# Patient Record
Sex: Male | Born: 1970 | Race: White | Hispanic: No | Marital: Married | Smoking: Never smoker
Health system: Southern US, Community
[De-identification: ages and names within clinical notes are randomized; demographics above are authoritative.]

## PROBLEM LIST (undated history)

## (undated) DIAGNOSIS — G4730 Sleep apnea, unspecified: Secondary | ICD-10-CM

## (undated) DIAGNOSIS — K219 Gastro-esophageal reflux disease without esophagitis: Secondary | ICD-10-CM

## (undated) DIAGNOSIS — M545 Low back pain, unspecified: Secondary | ICD-10-CM

## (undated) DIAGNOSIS — E785 Hyperlipidemia, unspecified: Secondary | ICD-10-CM

## (undated) HISTORY — DX: Sleep apnea, unspecified: G47.30

## (undated) HISTORY — DX: Gastro-esophageal reflux disease without esophagitis: K21.9

## (undated) HISTORY — DX: Hyperlipidemia, unspecified: E78.5

## (undated) HISTORY — DX: Low back pain, unspecified: M54.50

---
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
## 2003-01-02 ENCOUNTER — Encounter: Payer: Self-pay | Admitting: Family Medicine

## 2003-01-02 ENCOUNTER — Encounter: Admission: RE | Admit: 2003-01-02 | Discharge: 2003-01-02 | Payer: Self-pay | Admitting: Family Medicine

## 2006-08-05 ENCOUNTER — Ambulatory Visit: Payer: Self-pay | Admitting: Internal Medicine

## 2006-09-21 ENCOUNTER — Ambulatory Visit: Payer: Self-pay | Admitting: Family Medicine

## 2007-03-17 ENCOUNTER — Encounter (INDEPENDENT_AMBULATORY_CARE_PROVIDER_SITE_OTHER): Payer: Self-pay | Admitting: Family Medicine

## 2007-03-17 ENCOUNTER — Ambulatory Visit: Payer: Self-pay | Admitting: Family Medicine

## 2007-03-17 ENCOUNTER — Encounter: Payer: Self-pay | Admitting: Family Medicine

## 2007-03-17 DIAGNOSIS — M79609 Pain in unspecified limb: Secondary | ICD-10-CM

## 2007-03-17 DIAGNOSIS — J309 Allergic rhinitis, unspecified: Secondary | ICD-10-CM | POA: Insufficient documentation

## 2007-03-18 ENCOUNTER — Ambulatory Visit: Payer: Self-pay

## 2007-12-29 ENCOUNTER — Telehealth (INDEPENDENT_AMBULATORY_CARE_PROVIDER_SITE_OTHER): Payer: Self-pay | Admitting: *Deleted

## 2008-01-20 ENCOUNTER — Ambulatory Visit: Payer: Self-pay | Admitting: Family Medicine

## 2008-01-20 DIAGNOSIS — H6980 Other specified disorders of Eustachian tube, unspecified ear: Secondary | ICD-10-CM

## 2008-03-22 ENCOUNTER — Encounter: Payer: Self-pay | Admitting: Internal Medicine

## 2013-01-09 ENCOUNTER — Encounter (HOSPITAL_BASED_OUTPATIENT_CLINIC_OR_DEPARTMENT_OTHER): Payer: Self-pay | Admitting: *Deleted

## 2013-01-09 ENCOUNTER — Emergency Department (HOSPITAL_BASED_OUTPATIENT_CLINIC_OR_DEPARTMENT_OTHER): Payer: Managed Care, Other (non HMO)

## 2013-01-09 ENCOUNTER — Emergency Department (HOSPITAL_BASED_OUTPATIENT_CLINIC_OR_DEPARTMENT_OTHER)
Admission: EM | Admit: 2013-01-09 | Discharge: 2013-01-09 | Disposition: A | Discharge status: Home / Self Care | Payer: Managed Care, Other (non HMO) | Attending: Emergency Medicine | Admitting: Emergency Medicine

## 2013-01-09 DIAGNOSIS — S40021A Contusion of right upper arm, initial encounter: Secondary | ICD-10-CM

## 2013-01-09 DIAGNOSIS — S8010XA Contusion of unspecified lower leg, initial encounter: Secondary | ICD-10-CM | POA: Insufficient documentation

## 2013-01-09 DIAGNOSIS — S8012XA Contusion of left lower leg, initial encounter: Secondary | ICD-10-CM

## 2013-01-09 DIAGNOSIS — Y929 Unspecified place or not applicable: Secondary | ICD-10-CM | POA: Insufficient documentation

## 2013-01-09 DIAGNOSIS — Y939 Activity, unspecified: Secondary | ICD-10-CM | POA: Insufficient documentation

## 2013-01-09 DIAGNOSIS — W11XXXA Fall on and from ladder, initial encounter: Secondary | ICD-10-CM | POA: Insufficient documentation

## 2013-01-09 DIAGNOSIS — IMO0002 Reserved for concepts with insufficient information to code with codable children: Secondary | ICD-10-CM | POA: Insufficient documentation

## 2013-01-09 DIAGNOSIS — S5010XA Contusion of unspecified forearm, initial encounter: Secondary | ICD-10-CM | POA: Insufficient documentation

## 2013-01-09 MED ORDER — OXYCODONE-ACETAMINOPHEN 5-325 MG PO TABS
2.0000 | ORAL_TABLET | Freq: Once | ORAL | Status: AC
  Administered 2013-01-09: 2 via ORAL
  Filled 2013-01-09 (×2): qty 2

## 2013-01-09 MED ORDER — OXYCODONE-ACETAMINOPHEN 5-325 MG PO TABS: 2.0000 | ORAL_TABLET | ORAL | Status: DC | PRN

## 2013-01-09 NOTE — ED Provider Notes (Signed)
History     CSN: [id number]  Arrival date & time 01/09/13  1336   First MD Initiated Contact with Patient 01/09/13 1404      Chief Complaint  Patient presents with  . Fall    (Consider location/radiation/quality/duration/timing/severity/associated sxs/prior treatment) Patient is a 42 y.o. male presenting with fall. The history is provided by the patient. No language interpreter was used.  Fall The accident occurred less than 1 hour ago. The fall occurred from a ladder. He fell from a height of 3 to 5 ft. The volume of blood lost was minimal. Point of impact: upper left thigh/ right forearm. The pain is at a severity of 7/10. The pain is moderate. He was not ambulatory at the scene. He has tried nothing for the symptoms. The treatment provided moderate relief.  Pt complains of pain in his right upper thigh and left forearm   History reviewed. No pertinent past medical history.  History reviewed. No pertinent past surgical history.  History reviewed. No pertinent family history.  History  Substance Use Topics  . Smoking status: Never Smoker   . Smokeless tobacco: Not on file  . Alcohol Use: Yes      Review of Systems  Skin: Positive for wound.  All other systems reviewed and are negative.    Allergies  Review of patient's allergies indicates no known allergies.  Home Medications  No current outpatient prescriptions on file.  BP 119/65  Pulse 57  Temp 98.5 F (36.9 C) (Oral)  Resp 16  Ht 5\' 11"  (1.803 m)  Wt 170 lb (77.111 kg)  BMI 23.71 kg/m2  SpO2 100%  Physical Exam  Nursing note and vitals reviewed. Constitutional: He appears well-developed and well-nourished.  HENT:  Head: Normocephalic and atraumatic.  Eyes: Conjunctivae normal and EOM are normal. Pupils are equal, round, and reactive to light.  Neck: Normal range of motion.  Cardiovascular: Normal rate.   Pulmonary/Chest: Effort normal.  Abdominal: Soft.  Musculoskeletal:       Tender swollen  right forearm  Abrasion left upper thigh, bruised,  Abrasion left lower leg.     c spine, tspine and ls spine are normal  Neurological: He is alert.  Skin: Skin is warm.    ED Course  Procedures (including critical care time)  Labs Reviewed - No data to display Dg Forearm Right  01/09/2013  *RADIOLOGY REPORT*  Clinical Data: Fall, pain  RIGHT FOREARM - 2 VIEW  Comparison: None.  Findings: Normal alignment without fracture.  No soft tissue abnormality or foreign body.  Radius and ulna appear intact.  IMPRESSION: No acute osseous finding   Original Report Authenticated By: Judie Petit. Shick, M.D.    Dg Femur Left  01/09/2013  *RADIOLOGY REPORT*  Clinical Data: Fall, leg pain, bruising  LEFT FEMUR - 2 VIEW  Comparison: 01/09/2013  Findings: Left femur appears intact.  Negative for fracture or malalignment.  No soft tissue abnormality.  Peripheral vascular calcifications evident.  IMPRESSION: No acute osseous finding   Original Report Authenticated By: Judie Petit. Miles Costain, M.D.    Dg Tibia/fibula Left  01/09/2013  *RADIOLOGY REPORT*  Clinical Data: Fall, leg pain  LEFT TIBIA AND FIBULA - 2 VIEW  Comparison: None.  Findings: Left tibia and fibula appear intact with normal alignment.  No fracture or soft tissue abnormality.  No radiopaque foreign body.  IMPRESSION: No acute finding   Original Report Authenticated By: Judie Petit. Shick, M.D.      1. Contusion of left leg   2.  Contusion of right arm       MDM  Pt reports relief with pain medication,   Bandages to abrasions,   Pt given crutches.   I advised pt to see Dr. Pearletha Forge for recheck this week.   Ice to area of swelling and bruising        Lonia Skinner Cataract, [place] 01/09/13 2306

## 2013-01-09 NOTE — ED Notes (Addendum)
Pt states he fell 6 ft from a ladder and ? Landed on his feet but somehow on the way down his left leg got caught in rungs. How has bruising and abrasion to left thigh, lower leg right arm. Moves all but states left thigh feels numb. Denies neck pain.

## 2013-01-10 NOTE — ED Provider Notes (Signed)
Medical screening examination/treatment/procedure(s) were performed by non-physician practitioner and as supervising physician I was immediately available for consultation/collaboration.  Ethelda Chick, MD 01/10/13 [phone]

## 2019-10-14 ENCOUNTER — Other Ambulatory Visit (HOSPITAL_BASED_OUTPATIENT_CLINIC_OR_DEPARTMENT_OTHER): Payer: Self-pay

## 2019-10-14 DIAGNOSIS — R0683 Snoring: Secondary | ICD-10-CM

## 2019-10-14 DIAGNOSIS — G4730 Sleep apnea, unspecified: Secondary | ICD-10-CM

## 2019-12-07 ENCOUNTER — Ambulatory Visit (HOSPITAL_BASED_OUTPATIENT_CLINIC_OR_DEPARTMENT_OTHER): Payer: Managed Care, Other (non HMO) | Attending: Family Medicine | Admitting: Internal Medicine

## 2019-12-07 ENCOUNTER — Other Ambulatory Visit: Payer: Self-pay

## 2019-12-07 DIAGNOSIS — R0683 Snoring: Secondary | ICD-10-CM | POA: Diagnosis not present

## 2019-12-07 DIAGNOSIS — G4730 Sleep apnea, unspecified: Secondary | ICD-10-CM

## 2019-12-07 DIAGNOSIS — G4733 Obstructive sleep apnea (adult) (pediatric): Secondary | ICD-10-CM | POA: Diagnosis not present

## 2019-12-17 DIAGNOSIS — R0683 Snoring: Secondary | ICD-10-CM

## 2019-12-17 NOTE — Procedures (Signed)
   Patient Name: Manuel Martinez, Manuel Martinez Date: 12/07/2019 Gender: Male D.O.B: 23-Apr-1971 Age (years): 48 Referring Provider: Derinda Late Height (inches): 71 Interpreting Physician: Baird Lyons MD, ABSM Weight (lbs): 180 RPSGT: Jacolyn Reedy BMI: 25 MRN: [id number] Neck Size: 17.00  CLINICAL INFORMATION Sleep Study Type: HST Indication for sleep study: Snoring Epworth Sleepiness Score: 4  SLEEP STUDY TECHNIQUE A multi-channel overnight portable sleep study was performed. The channels recorded were: nasal airflow, thoracic respiratory movement, and oxygen saturation with a pulse oximetry. Snoring was also monitored.  MEDICATIONS Patient self administered medications include: none reported.  SLEEP ARCHITECTURE Patient was studied for 372 minutes. The sleep efficiency was 99.1 % and the patient was supine for 63.7%. The arousal index was 0.0 per hour.  RESPIRATORY PARAMETERS The overall AHI was 13.4 per hour, with a central apnea index of 0.0 per hour. The oxygen nadir was 86% during sleep.  CARDIAC DATA Mean heart rate during sleep was 62.0 bpm.  IMPRESSIONS - Mild obstructive sleep apnea occurred during this study (AHI = 13.4/h). - No significant central sleep apnea occurred during this study (CAI = 0.0/h). - Moderate oxygen desaturation was noted during this study (Min O2 = 86%). Mean sat 95%. - Patient snored.  DIAGNOSIS - Obstructive Sleep Apnea (327.23 [G47.33 ICD-10])  RECOMMENDATIONS - Suggest CPAP autopap or CPAP titration  sleep study. Other options would be based on clinical judgment. - Be careful with alcohol, sedatives and other CNS depressants that may worsen sleep apnea and disrupt normal sleep architecture. - Sleep hygiene should be reviewed to assess factors that may improve sleep quality. - Weight management and regular exercise should be initiated or continued.  [Electronically signed] 12/17/2019 11:45 AM  Baird Lyons MD, ABSM Diplomate,  American Board of Sleep Medicine   NPI: [id number]                          [place], [place] [facility]  ELECTRONICALLY SIGNED ON:  12/17/2019, 11:42 AM [place] PH: [phone]   FX: [phone] [place]

## 2020-01-11 ENCOUNTER — Encounter: Payer: Self-pay | Admitting: Pulmonary Disease

## 2020-01-11 ENCOUNTER — Ambulatory Visit: Payer: Managed Care, Other (non HMO) | Admitting: Pulmonary Disease

## 2020-01-11 ENCOUNTER — Other Ambulatory Visit: Payer: Self-pay

## 2020-01-11 VITALS — BP 140/88 | HR 63 | Temp 97.9°F | Ht 71.0 in | Wt 179.0 lb

## 2020-01-11 DIAGNOSIS — G4733 Obstructive sleep apnea (adult) (pediatric): Secondary | ICD-10-CM

## 2020-01-11 NOTE — Progress Notes (Signed)
[facility]  Chief Complaint  Patient presents with  . Sleep Consult    Snoring-had sleep study done 12/20/2019. Epworth=5.    Constitutional:  BP 140/88 (BP Location: Left Arm, Cuff Size: Normal)   Pulse 63   Temp 97.9 F (36.6 C) (Oral)   Ht 5\' 11"  (1.803 m)   Wt 179 lb (81.2 kg)   SpO2 98% Comment: on RA  BMI 24.97 kg/m   Past Medical History:  GERD, HLD, Neuropathy  Brief Summary:  Manuel Martinez is a 49 y.o. male with obstructive sleep apnea.  He had a sleep study about 10 years ago.  Was fitted with an oral appliance.  Used intermittently, but not recently.  His wife has been concerned about his snoring, and has to sleep in separate room.  He will also stop breathing at night.  He had home sleep study that showed mild sleep apnea.  He goes to bed at 9 pm.  Falls asleep in 10 minutes.  Wakes up sometimes to use the bathroom.  Gets up at 7 am.  Feels okay in the morning.  Doesn't use anything to help sleep or stay awake.  He denies sleep walking, sleep talking, bruxism, or nightmares.  There is no history of restless legs.  He denies sleep hallucinations, sleep paralysis, or cataplexy.  The Epworth score is 5 out of 24.   Physical Exam:   Appearance - well kempt   ENMT - clear nasal mucosa, mild nasal septum deviation, no oral exudates, no LAN, trachea midline, high arched palate, over bite, MP 3  Respiratory - normal chest wall, normal respiratory effort, no accessory muscle use, no wheeze/rales  CV - s1s2 regular rate and rhythm, no murmurs, no peripheral edema, radial pulses symmetric  GI - soft, non tender, no masses  Lymph - no adenopathy noted in neck and axillary areas  MSK - normal gait  Ext - no cyanosis, clubbing, or joint inflammation noted  Skin - no rashes, lesions, or ulcers  Neuro - normal strength, oriented x 3  Psych - normal mood and affect   Assessment/Plan:   Snoring with excessive daytime  sleepiness obstructive sleep apnea. - sleep study reviewed with him - various therapies for treatment were reviewed: CPAP, oral appliance, and surgical interventions - he has tried oral appliance previously with inconsistent response - he is willing to try auto CPAP  Obesity. - discussed how weight can impact sleep and risk for sleep disordered breathing - discussed options to assist with weight loss: combination of diet modification, cardiovascular and strength training exercises  Cardiovascular risk. - had an extensive discussion regarding the adverse health consequences related to untreated sleep disordered breathing - specifically discussed the risks for hypertension, coronary artery disease, cardiac dysrhythmias, cerebrovascular disease, and diabetes - lifestyle modification discussed  Safe driving practices. - discussed how sleep disruption can increase risk of accidents, particularly when driving - safe driving practices were discussed   Patient Instructions  Will arrange for auto CPAP set up  Follow up in 2 months  A total of  38 minutes were spent face to face and non-face to face with the patient and more than half of that time involved counseling or coordination of care.   51, MD [facility] Pager: [phone] 01/11/2020, 3:51 PM  Flow Sheet    Sleep tests:  HST 12/07/19 >> AHI 13.4, SpO2 low 86%  Medications:   Allergies as of 01/11/2020   No Known  Allergies     Medication List       Accurate as of January 11, 2020  3:51 PM. If you have any questions, ask your nurse or doctor.        STOP taking these medications   oxyCODONE-acetaminophen 5-325 MG tablet Commonly known as: PERCOCET/ROXICET Stopped by: Chesley Mires, MD     TAKE these medications   gabapentin 100 MG capsule Commonly known as: NEURONTIN Take 100 mg by mouth daily as needed.   omeprazole 40 MG capsule Commonly known as: PRILOSEC Take 40 mg by mouth  daily.   rosuvastatin 10 MG tablet Commonly known as: CRESTOR Take 10 mg by mouth daily.       Past Surgical History:  He denies prior surgeries.  Family History:  His mother had snoring.  Social History:  He  reports that he has never smoked. He quit smokeless tobacco use about 4 months ago.  His smokeless tobacco use included chew. He reports current alcohol use. He reports that he does not use drugs.

## 2020-01-11 NOTE — Patient Instructions (Signed)
Will arrange for auto CPAP set up  Follow up in 2 months 

## 2020-03-20 ENCOUNTER — Ambulatory Visit: Payer: Managed Care, Other (non HMO) | Admitting: Pulmonary Disease

## 2020-03-20 ENCOUNTER — Other Ambulatory Visit: Payer: Self-pay

## 2020-03-20 ENCOUNTER — Encounter: Payer: Self-pay | Admitting: Pulmonary Disease

## 2020-03-20 VITALS — BP 120/86 | HR 78 | Temp 97.4°F | Ht 71.0 in | Wt 175.0 lb

## 2020-03-20 DIAGNOSIS — G4733 Obstructive sleep apnea (adult) (pediatric): Secondary | ICD-10-CM

## 2020-03-20 NOTE — Progress Notes (Signed)
[facility]  Chief Complaint  Patient presents with  . Follow-up    Doing well with CPAP. He is averaging 6 1/2 hours of sleep with his CPAP and feels well rested during the day.     Constitutional:  BP 120/86 (BP Location: Right Arm, Cuff Size: Normal)   Pulse 78   Temp (!) 97.4 F (36.3 C) (Temporal)   Ht 5\' 11"  (1.803 m)   Wt 175 lb (79.4 kg)   SpO2 98%   BMI 24.41 kg/m   Past Medical History:  GERD, HLD, Neuropathy  Brief Summary:  Manuel Martinez is a 49 y.o. male with obstructive sleep apnea.  No longer snoring.  Wife able to sleep in same room now.  Has nasal pillows.  No issue with mask fit.  Not having sinus congestion, sore throat, dry mouth, or aerophagia.  Wants to get a travel machine.   Physical Exam:   Appearance - well kempt   ENMT - no sinus tenderness, no nasal discharge, no oral exudate  Respiratory - normal appearance of chest wall, normal respiratory effort w/o accessory muscle use, no dullness on percussion, no wheezing or rales  CV - s1s2 regular rate and rhythm, no murmurs, no peripheral edema, radial pulses symmetric  Ext - no cyanosis, clubbing, or joint inflammation noted  Psych - normal mood and affect   Assessment/Plan:   Obstructive sleep apnea. - he is compliant with CPAP and reports benefit - continue auto CPAP - he will contact office when he has information regarding travel machine he would like to purchase    Patient Instructions  Follow up in 1 year    49, MD [facility] [facility] Pager: [phone] 03/20/2020, 11:15 AM  Flow Sheet    Sleep tests:  HST 12/07/19 >> AHI 13.4, SpO2 low 86% Auto CPAP 02/18/20 to 03/18/20 >> used on 30 of 30 nights with average 7 hrs 14 min.  Average AHI 1.3 with median CPAP 6 and 95 th percentile CPAP 8 cm H2O.  Medications:   Allergies as of 03/20/2020   No Known Allergies     Medication List       Accurate as of  March 20, 2020 11:15 AM. If you have any questions, ask your nurse or doctor.        STOP taking these medications   omeprazole 40 MG capsule Commonly known as: PRILOSEC Stopped by: March 22, 2020, MD     TAKE these medications   cetirizine 10 MG tablet Commonly known as: ZYRTEC Take 10 mg by mouth daily.   gabapentin 100 MG capsule Commonly known as: NEURONTIN Take 100 mg by mouth daily as needed.   rosuvastatin 10 MG tablet Commonly known as: CRESTOR Take 10 mg by mouth daily.       Past Surgical History:  He denies prior surgeries.  Family History:  His mother had snoring.  Social History:  He  reports that he has never smoked. He quit smokeless tobacco use about 6 months ago.  His smokeless tobacco use included chew. He reports current alcohol use. He reports that he does not use drugs.

## 2020-03-20 NOTE — Patient Instructions (Signed)
Follow up in 1 year.

## 2020-03-27 NOTE — Telephone Encounter (Signed)
Dr. Craige Cotta please advise on patient's mychart message:  Need prescription sent to [email or web address] so I can order a travel cpap. see attached.  thanks, Manuel Martinez

## 2020-04-03 NOTE — Telephone Encounter (Signed)
Form signed, and in my office.

## 2020-04-10 ENCOUNTER — Ambulatory Visit (INDEPENDENT_AMBULATORY_CARE_PROVIDER_SITE_OTHER): Payer: Managed Care, Other (non HMO) | Admitting: Sports Medicine

## 2020-04-10 ENCOUNTER — Encounter: Payer: Self-pay | Admitting: Sports Medicine

## 2020-04-10 ENCOUNTER — Other Ambulatory Visit: Payer: Self-pay

## 2020-04-10 ENCOUNTER — Ambulatory Visit (INDEPENDENT_AMBULATORY_CARE_PROVIDER_SITE_OTHER): Payer: Managed Care, Other (non HMO)

## 2020-04-10 DIAGNOSIS — M79671 Pain in right foot: Secondary | ICD-10-CM

## 2020-04-10 NOTE — Assessment & Plan Note (Signed)
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 He was able to continue to bear weight, unfortunately after another game he is now at the point where he cannot bear weight, pain and bruising on the plantar aspect of the heel. He desires aggressive treatment today as he does have a triathlon coming up in 2 weeks. We are going to treat him aggressively, plantar fascia injection, cam boot, x-rays, MRI. Return to see me in 2 weeks right before the triathlon to see how things are going.

## 2020-04-10 NOTE — Progress Notes (Signed)
    Procedures performed today:    Procedure: Real-time Ultrasound Guided injection of the right plantar fascia origin Device: Samsung HS60  Verbal informed consent obtained.  Time-out conducted.  Noted no overlying erythema, induration, or other signs of local infection.  Skin prepped in a sterile fashion.  Local anesthesia: Topical Ethyl chloride.  With sterile technique and under real time ultrasound guidance: 1 cc Kenalog 40, 1 cc lidocaine, 1 cc bupivacaine injected easily Completed without difficulty  Pain immediately resolved suggesting accurate placement of the medication.  Advised to call if fevers/chills, erythema, induration, drainage, or persistent bleeding.  Images permanently stored and available for review in the ultrasound unit.  Impression: Technically successful ultrasound guided injection.  Independent interpretation of notes and tests performed by another provider:   None.  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 He was able to continue to bear weight, unfortunately after another game he is now at the point where he cannot bear weight, pain and bruising on the plantar aspect of the heel. He desires aggressive treatment today as he does have a triathlon coming up in 2 weeks. We are going to treat him aggressively, plantar fascia injection, cam boot, x-rays, MRI. Return to see me in 2 weeks right before the triathlon to see how things are going.    ___________________________________________ [name]. Benjamin Stain, M.D., ABFM., CAQSM. Primary Care and Sports Medicine [facility]  Adjunct Instructor of Family Medicine  [employer]

## 2020-04-15 ENCOUNTER — Other Ambulatory Visit: Payer: Self-pay

## 2020-04-15 ENCOUNTER — Ambulatory Visit (INDEPENDENT_AMBULATORY_CARE_PROVIDER_SITE_OTHER): Payer: Managed Care, Other (non HMO)

## 2020-04-15 DIAGNOSIS — M79671 Pain in right foot: Secondary | ICD-10-CM

## 2020-04-20 ENCOUNTER — Ambulatory Visit (INDEPENDENT_AMBULATORY_CARE_PROVIDER_SITE_OTHER): Payer: Managed Care, Other (non HMO) | Admitting: Sports Medicine

## 2020-04-20 ENCOUNTER — Other Ambulatory Visit: Payer: Self-pay

## 2020-04-20 DIAGNOSIS — M79671 Pain in right foot: Secondary | ICD-10-CM | POA: Diagnosis not present

## 2020-04-20 MED ORDER — HYDROCODONE-ACETAMINOPHEN 5-325 MG PO TABS: 1.0000 | ORAL_TABLET | Freq: Three times a day (TID) | ORAL | 0 refills | Status: DC | PRN

## 2020-04-20 NOTE — Progress Notes (Signed)
    Procedures performed today:    None.  Independent interpretation of notes and tests performed by another provider:   None.  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 He had taken a misstep and felt a tearing sensation of the plantar aspect of his heel. He did have a triathlon coming up so we treated him aggressively with plantar fascia injection, cam boot, MRI. He returns today about 75% better, he can put weight on the foot but still occasionally gets a sharp pain. Overall he feels better and feels like he can participate in the triathlon, ongoing to give him a short course of hydrocodone to take prior to his competition to keep him going. He can discontinue the boot and return to see me in a month. I have asked him to send me a MyChart message after the triathlon to let me know how things went. Of note his MRI showed multiple pathologic findings some of which are considered a normal finding in runners such as ankle joint effusion and medial and lateral tendon sheath effusions. He did have significant plantar fasciitis on the MRI as well.    ___________________________________________ Manuel Martinez. Manuel Martinez, M.D., ABFM., CAQSM. Primary Care and Sports Medicine [facility]  Adjunct Instructor of Family Medicine  [employer]

## 2020-04-20 NOTE — Assessment & Plan Note (Signed)
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 He did have a triathlon coming up so we treated him aggressively with plantar fascia injection, cam boot, MRI. He returns today about 75% better, he can put weight on the foot but still occasionally gets a sharp pain. Overall he feels better and feels like he can participate in the triathlon, ongoing to give him a short course of hydrocodone to take prior to his competition to keep him going. He can discontinue the boot and return to see me in a month. I have asked him to send me a MyChart message after the triathlon to let me know how things went. Of note his MRI showed multiple pathologic findings some of which are considered a normal finding in runners such as ankle joint effusion and medial and lateral tendon sheath effusions. He did have significant plantar fasciitis on the MRI as well.

## 2020-05-10 ENCOUNTER — Other Ambulatory Visit: Payer: Self-pay | Admitting: Family Medicine

## 2020-05-10 DIAGNOSIS — R1310 Dysphagia, unspecified: Secondary | ICD-10-CM

## 2020-05-16 ENCOUNTER — Ambulatory Visit
Admission: RE | Admit: 2020-05-16 | Discharge: 2020-05-16 | Disposition: A | Discharge status: Home / Self Care | Payer: Managed Care, Other (non HMO) | Source: Ambulatory Visit | Attending: Family Medicine | Admitting: Family Medicine

## 2020-05-16 DIAGNOSIS — R1310 Dysphagia, unspecified: Secondary | ICD-10-CM

## 2020-05-18 ENCOUNTER — Other Ambulatory Visit: Payer: Self-pay

## 2020-05-18 ENCOUNTER — Encounter: Payer: Self-pay | Admitting: Sports Medicine

## 2020-05-18 ENCOUNTER — Ambulatory Visit (INDEPENDENT_AMBULATORY_CARE_PROVIDER_SITE_OTHER): Payer: Managed Care, Other (non HMO)

## 2020-05-18 ENCOUNTER — Ambulatory Visit (INDEPENDENT_AMBULATORY_CARE_PROVIDER_SITE_OTHER): Payer: Managed Care, Other (non HMO) | Admitting: Sports Medicine

## 2020-05-18 DIAGNOSIS — M222X1 Patellofemoral disorders, right knee: Secondary | ICD-10-CM | POA: Diagnosis not present

## 2020-05-18 DIAGNOSIS — M79671 Pain in right foot: Secondary | ICD-10-CM

## 2020-05-18 DIAGNOSIS — S39011A Strain of muscle, fascia and tendon of abdomen, initial encounter: Secondary | ICD-10-CM

## 2020-05-18 NOTE — Assessment & Plan Note (Signed)
Manuel Martinez returns, he did well in his triathlon, we did inject his plantar fascia prior to the event, he is doing okay, he has a bit of tenderness of the mid plantar fascia that will likely result in plantar fascial fibromatosis, otherwise we can revisit this on an as-needed basis.

## 2020-05-18 NOTE — Progress Notes (Signed)
    Procedures performed today:    None.  Independent interpretation of notes and tests performed by another provider:   None.  Brief History, Exam, Impression, and Recommendations:    Right foot pain Manuel Martinez returns, he did well in his triathlon, we did inject his plantar fascia prior to the event, he is doing okay, he has a bit of tenderness of the mid plantar fascia that will likely result in plantar fascial fibromatosis, otherwise we can revisit this on an as-needed basis.  Patellofemoral syndrome, right Manuel Martinez does have a fairly history with his knee with what sounds to be a bucket-handle meniscal tear post arthroscopic partial meniscectomy, he was doing well until his run, started to have increasing pain anteriorly, now worse going up and down stairs. Hip abductor strength is good. His exam is completely benign, this is likely simple patellofemoral syndrome, adding patellofemoral rehab, knee x-rays, return to see me in 2 or 3 weeks, we will consider injection if no better.  Abdominal muscle strain Working out with his son, overdid it, strained his right obliques, I think this will resolve on its own.    ___________________________________________ [name]. Benjamin Stain, M.D., ABFM., CAQSM. Primary Care and Sports Medicine [facility]  Adjunct Instructor of Family Medicine  [employer]

## 2020-05-18 NOTE — Assessment & Plan Note (Signed)
Manuel Martinez does have a fairly history with his knee with what sounds to be a bucket-handle meniscal tear post arthroscopic partial meniscectomy, he was doing well until his run, started to have increasing pain anteriorly, now worse going up and down stairs. Hip abductor strength is good. His exam is completely benign, this is likely simple patellofemoral syndrome, adding patellofemoral rehab, knee x-rays, return to see me in 2 or 3 weeks, we will consider injection if no better.

## 2020-05-18 NOTE — Assessment & Plan Note (Signed)
Working out with his son, overdid it, strained his right obliques, I think this will resolve on its own.

## 2020-06-06 ENCOUNTER — Ambulatory Visit: Payer: Managed Care, Other (non HMO) | Admitting: Sports Medicine

## 2020-06-13 ENCOUNTER — Other Ambulatory Visit: Payer: Self-pay

## 2020-06-13 ENCOUNTER — Ambulatory Visit (INDEPENDENT_AMBULATORY_CARE_PROVIDER_SITE_OTHER): Payer: Managed Care, Other (non HMO) | Admitting: Otolaryngology

## 2020-06-13 ENCOUNTER — Encounter (INDEPENDENT_AMBULATORY_CARE_PROVIDER_SITE_OTHER): Payer: Self-pay | Admitting: Otolaryngology

## 2020-06-13 VITALS — Temp 97.7°F

## 2020-06-13 DIAGNOSIS — K219 Gastro-esophageal reflux disease without esophagitis: Secondary | ICD-10-CM | POA: Diagnosis not present

## 2020-06-13 NOTE — Progress Notes (Signed)
HPI: Manuel Martinez is a 49 y.o. male who presents is referred by Dr. Sandi Mariscal for evaluation of reflux problems.  Patient states that he has had reflux problems for a number of years.  He has to clear his throat a lot where he cannot function well.  He has done research on GERD as well as laryngeal pharyngeal reflux for several years and is altered his diet as well has tried several different medications.  He has had previous endoscopy with GI several years ago.  He just recently underwent a barium swallow that showed a small hiatal hernia but was otherwise normal.  He has tried several antacids in the past including Prilosec with minimal benefit.  He chronically clears his throat even in the office today.Marland Kitchen He does have history of allergies and uses Nasacort and Zyrtec although his allergies do not bother him that much at this point.  Past Medical History:  Diagnosis Date  . GERD (gastroesophageal reflux disease)   . Hyperlipidemia   . Lumbago   . Sleep apnea    No past surgical history on file. Social History   Socioeconomic History  . Marital status: Married    Spouse name: Not on file  . Number of children: Not on file  . Years of education: Not on file  . Highest education level: Not on file  Occupational History  . Not on file  Tobacco Use  . Smoking status: Never Smoker  . Smokeless tobacco: Former Systems developer    Types: Secondary school teacher  . Vaping Use: Never used  Substance and Sexual Activity  . Alcohol use: Yes  . Drug use: No  . Sexual activity: Not on file  Other Topics Concern  . Not on file  Social History Narrative  . Not on file   Social Determinants of Health   Financial Resource Strain:   . Difficulty of Paying Living Expenses:   Food Insecurity:   . Worried About Charity fundraiser in the Last Year:   . Arboriculturist in the Last Year:   Transportation Needs:   . Film/video editor (Medical):   Marland Kitchen Lack of Transportation (Non-Medical):   Physical Activity:    . Days of Exercise per Week:   . Minutes of Exercise per Session:   Stress:   . Feeling of Stress :   Social Connections:   . Frequency of Communication with Friends and Family:   . Frequency of Social Gatherings with Friends and Family:   . Attends Religious Services:   . Active Member of Clubs or Organizations:   . Attends Archivist Meetings:   Marland Kitchen Marital Status:    No family history on file. No Known Allergies Prior to Admission medications   Medication Sig Start Date End Date Taking? Authorizing Provider  cetirizine (ZYRTEC) 10 MG tablet Take 10 mg by mouth daily.   Yes [provider]  gabapentin (NEURONTIN) 100 MG capsule Take 100 mg by mouth daily as needed.   Yes [provider]  rosuvastatin (CRESTOR) 10 MG tablet Take 10 mg by mouth daily.   Yes [provider]     Positive ROS: Otherwise negative  All other systems have been reviewed and were otherwise negative with the exception of those mentioned in the HPI and as above.  Physical Exam: Constitutional: Alert, well-appearing, no acute distress Ears: External ears without lesions or tenderness. Ear canals are clear bilaterally with intact, clear TMs.  Nasal: External nose without  lesions. Septum midline with mild rhinitis..  Nasal passages are clear bilaterally.  Both middle meatus regions are clear with minimal clear mucus within the nasal cavity. Oral: Lips and gums without lesions. Tongue and palate mucosa without lesions. Posterior oropharynx clear.  Normal-appearing mucus within the posterior oral cavity. Fiberoptic laryngoscopy was performed to the right nostril.  The nasopharynx was clear.  The base of tongue vallecula epiglottis were normal.  Vocal cords were clear bilaterally with normal vocal cord mobility.  Both piriform sinuses were clear bilaterally.  No mucosal abnormalities noted.  Of note patient did have a strong gag reflex. Neck: No palpable adenopathy or  masses. Respiratory: Breathing comfortably  Skin: No facial/neck lesions or rash noted.  Laryngoscopy  Date/Time: 06/13/2020 1:30 PM Performed by: Drema Halon, MD Authorized by: Drema Halon, MD   Consent:    Consent obtained:  Verbal   Consent given by:  Patient Procedure details:    Indications: direct visualization of the upper aerodigestive tract and excessive gag reflex preventing mirror examination     Medication:  Afrin   Scope location: right nare   Sinus:    Right nasopharynx: normal   Mouth:    Oropharynx: normal     Vallecula: normal     Base of tongue: normal     Epiglottis: normal   Throat:    True vocal cords: normal   Comments:     On fiberoptic laryngoscopy the hypopharynx and larynx was clear to examination with no significant mucosal abnormalities noted.    Assessment: Chronic laryngeal pharyngeal reflux disease  Plan: Patient is presently not on antacid therapy although he is used several different types in the past.  I agree with his present modification of his diet as he has done extensive research into GERD and laryngeal pharyngeal reflux. I prescribed him omeprazole 40 mg daily before dinner. Also suggested that he try not to clear his throat quite so much and perhaps sip some water or take a throat lozenges or wintergreen or peppermint candy to help reduce sensation of a need for throat clearing. Reassured him of normal upper airway examination on fiberoptic laryngoscopy. He might be better evaluated by gastroenterology and their recommendations.   Narda Bonds, MD   CC:

## 2020-11-27 ENCOUNTER — Ambulatory Visit: Payer: Managed Care, Other (non HMO) | Admitting: Sports Medicine

## 2020-11-28 ENCOUNTER — Other Ambulatory Visit: Payer: Self-pay

## 2020-11-28 ENCOUNTER — Ambulatory Visit: Payer: Managed Care, Other (non HMO) | Admitting: Sports Medicine

## 2020-11-28 DIAGNOSIS — M79671 Pain in right foot: Secondary | ICD-10-CM

## 2020-11-28 NOTE — Progress Notes (Signed)
    Procedures performed today:    None.  Independent interpretation of notes and tests performed by another provider:   None.  Brief History, Exam, Impression, and Recommendations:    Right foot pain Manuel Martinez is a very pleasant 49 year old male, he is a triathlete, we have been treating him in the spring for plantar fasciitis, ultimately he did well with an injection. Unfortunately he has been having several months of pain on his fourth and fifth metatarsal shafts of the right foot. He did see Dr. Penni Bombard and about a month and a half ago had an x-ray that was unrevealing. Due to persistence of discomfort despite greater than 6 weeks of physician directed conservative treatment we are going to proceed with an MRI of his foot hopefully done on Saturday. Treatment will depend on what we see on the MRI although his signs and symptoms point to a stress injury.    ___________________________________________ [name]. Benjamin Stain, M.D., ABFM., CAQSM. Primary Care and Sports Medicine [facility]  Adjunct Instructor of Family Medicine  [employer]

## 2020-11-28 NOTE — Assessment & Plan Note (Signed)
Manuel Martinez is a very pleasant 49 year old male, he is a triathlete, we have been treating him in the spring for plantar fasciitis, ultimately he did well with an injection. Unfortunately he has been having several months of pain on his fourth and fifth metatarsal shafts of the right foot. He did see Dr. Penni Bombard and about a month and a half ago had an x-ray that was unrevealing. Due to persistence of discomfort despite greater than 6 weeks of physician directed conservative treatment we are going to proceed with an MRI of his foot hopefully done on Saturday. Treatment will depend on what we see on the MRI although his signs and symptoms point to a stress injury.

## 2020-12-02 ENCOUNTER — Other Ambulatory Visit: Payer: Self-pay

## 2020-12-02 ENCOUNTER — Ambulatory Visit (INDEPENDENT_AMBULATORY_CARE_PROVIDER_SITE_OTHER): Payer: Managed Care, Other (non HMO)

## 2020-12-02 DIAGNOSIS — M79671 Pain in right foot: Secondary | ICD-10-CM | POA: Diagnosis not present

## 2021-02-27 IMAGING — MR MR HEEL *R* W/O CM
5 series · 40 of 40 positions shown · non-contrast
Comparison: Radiographs 04/10/2020

CLINICAL DATA: Chronic severe heel pain with inability to bear
weight. Soccer injury 1 week ago. Suspected plantar fasciitis with
plantar fascial tear. No previous relevant surgery.

EXAM:
MR OF THE RIGHT HEEL WITHOUT CONTRAST
TECHNIQUE: Multiplanar, multisequence MR imaging of the right hindfoot was
performed. No intravenous contrast was administered.

[Series 7: PD fat-sat · axial · 3.0mm · 0.62mm/px · z∈[-114,+26]mm · 8 of 36 slices shown]
[im 1/36]
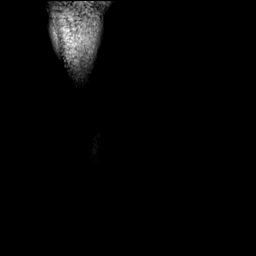
[im 6/36]
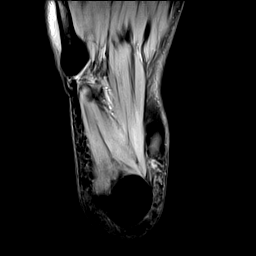
[im 11/36]
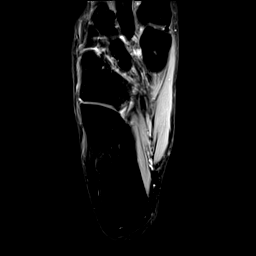
[im 16/36]
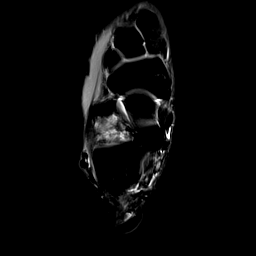
[im 21/36]
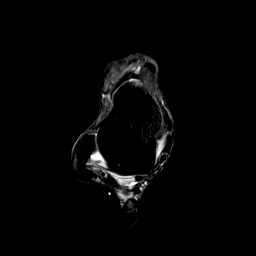
[im 26/36]
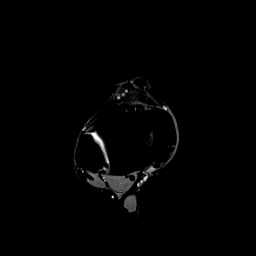
[im 31/36]
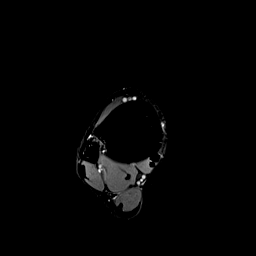
[im 36/36]
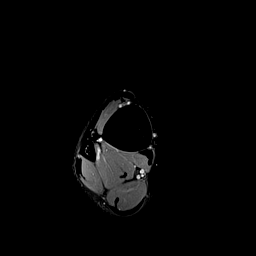

[Series 8: T2 fat-sat · coronal · 3.0mm · 0.62mm/px · 9 of 37 slices shown (1 of 2)]
[im 1/37]
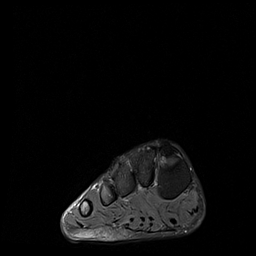
[im 5/37]
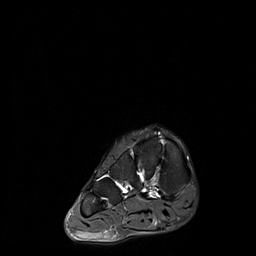
[im 10/37]
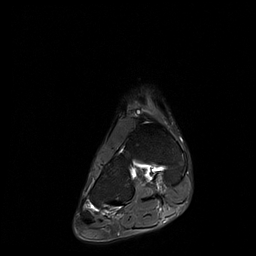
[im 14/37]
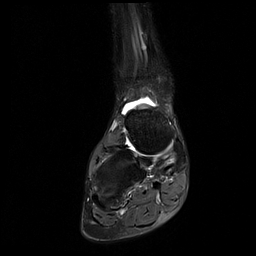
[im 19/37]
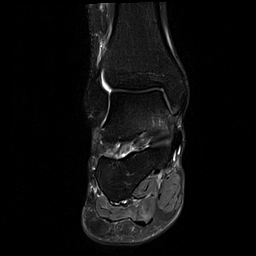
[im 23/37]
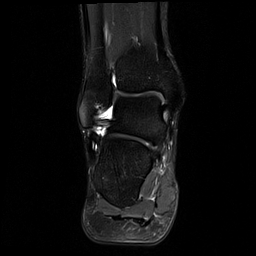
[im 28/37]
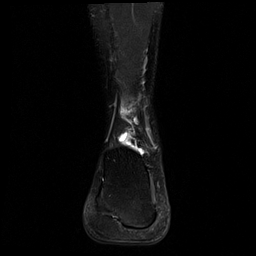
[im 32/37]
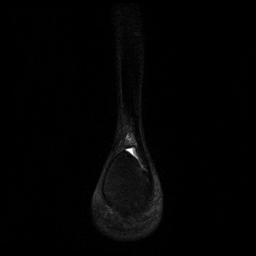
[im 37/37]
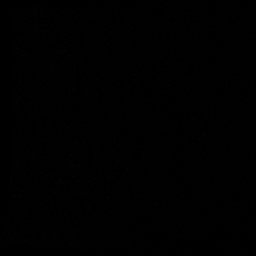

[Series 9: T2 fat-sat · axial · 3.0mm · 0.62mm/px · z∈[-114,+26]mm · 9 of 36 slices shown (2 of 2)]
[im 1/36]
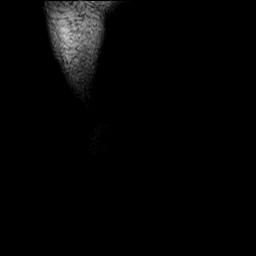
[im 5/36]
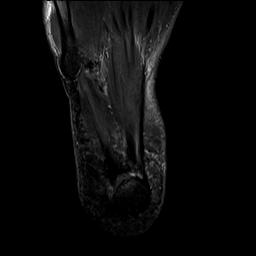
[im 9/36]
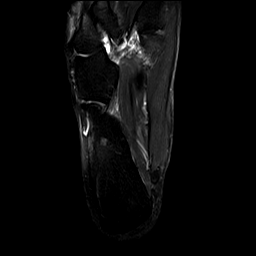
[im 14/36]
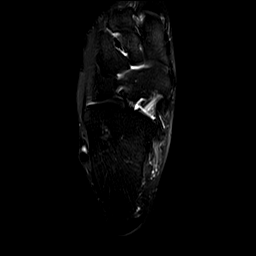
[im 18/36]
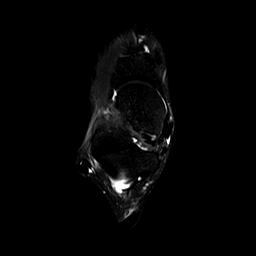
[im 22/36]
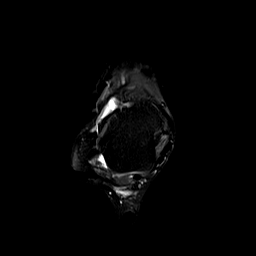
[im 27/36]
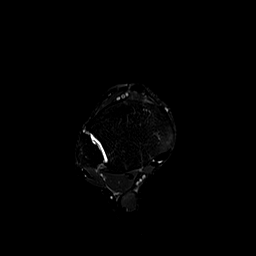
[im 31/36]
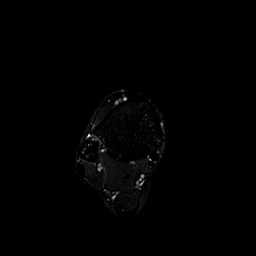
[im 36/36]
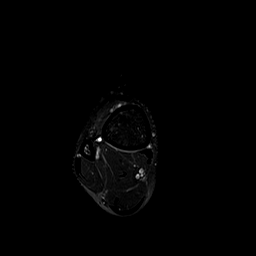

[Series 10: T1 · sagittal · 3.0mm · 0.62mm/px · 7 of 27 slices shown]
[im 1/27]
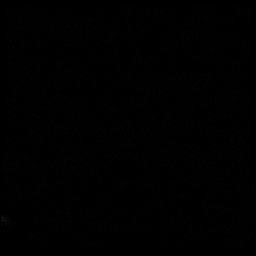
[im 5/27]
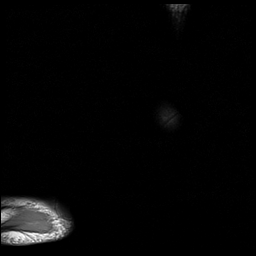
[im 9/27]
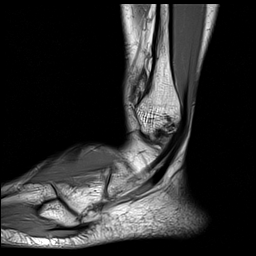
[im 14/27]
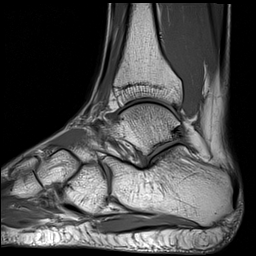
[im 18/27]
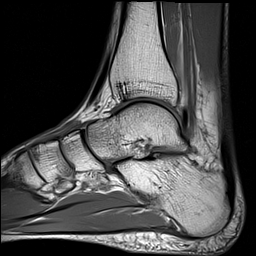
[im 22/27]
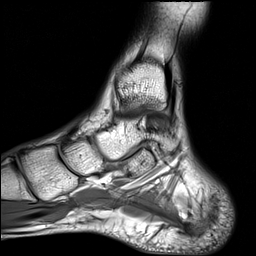
[im 27/27]
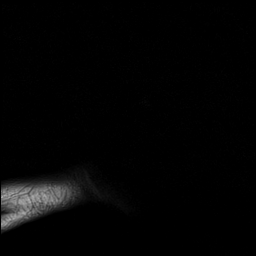

[Series 11: STIR · sagittal · 3.0mm · 0.62mm/px · 7 of 27 slices shown]
[im 1/27]
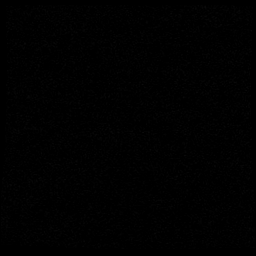
[im 5/27]
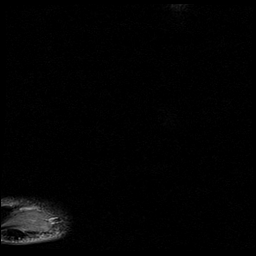
[im 9/27]
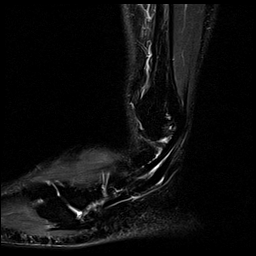
[im 14/27]
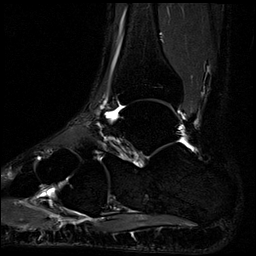
[im 18/27]
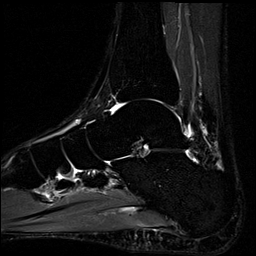
[im 22/27]
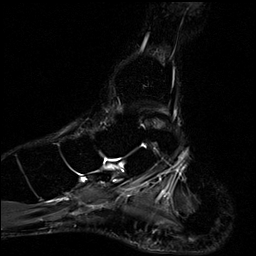
[im 27/27]
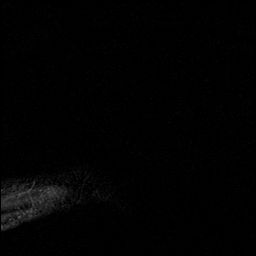

[40 of 40 positions shown; findings below may reference images not displayed]

FINDINGS: TENDONS

Peroneal: Intact and normally positioned.

Posteromedial: Intact and normally positioned.

Anterior: Intact and normally positioned.

Achilles: Intact.

Plantar Fascia: Focal T2 hyperintensity at the calcaneal attachment
the medial cord consistent with plantar fasciitis. No fascial
rupture or retraction. Minimal surrounding soft tissue edema. No
calcaneal spur or significant reactive marrow edema.

LIGAMENTS

Lateral: The anterior and posterior talofibular and calcaneofibular
ligaments are intact.

Medial: The deltoid ligament appears edematous, although intact. The
visualized portions of the spring ligament are intact.

CARTILAGE AND BONES

Ankle Joint: Small ankle joint effusion. The talar dome and tibial
plafond are intact.

Subtalar Joints/Sinus Tarsi: The subtalar joint appears normal.
There is mild edema in the tarsal sinus without obscuration of the
ligaments or normal sinus fat.

Bones: No significant extra-articular osseous findings.

Other: No significant soft tissue findings.
IMPRESSION: 1. Findings consistent with mild plantar fasciitis. No fascial
rupture or retraction.
2. Possible sprain of the deltoid ligament.
3. Small ankle joint effusion.  No acute osseous findings.
4. Intact ankle tendons.
